# Patient Record
Sex: Female | Born: 1980 | Hispanic: No | Marital: Married | Smoking: Current every day smoker
Health system: Southern US, Community
[De-identification: ages and names within clinical notes are randomized; demographics above are authoritative.]

## PROBLEM LIST (undated history)

## (undated) DIAGNOSIS — O321XX0 Maternal care for breech presentation, not applicable or unspecified: Secondary | ICD-10-CM

## (undated) DIAGNOSIS — O4400 Placenta previa specified as without hemorrhage, unspecified trimester: Secondary | ICD-10-CM

## (undated) HISTORY — PX: WISDOM TOOTH EXTRACTION: SHX21

---

## 2007-06-24 ENCOUNTER — Ambulatory Visit (HOSPITAL_COMMUNITY): Admission: RE | Admit: 2007-06-24 | Discharge: 2007-06-24 | Payer: Self-pay | Admitting: Obstetrics & Gynecology

## 2007-09-16 ENCOUNTER — Inpatient Hospital Stay (HOSPITAL_COMMUNITY): Admission: AD | Admit: 2007-09-16 | Discharge: 2007-09-16 | Payer: Self-pay | Admitting: Obstetrics and Gynecology

## 2007-09-18 ENCOUNTER — Inpatient Hospital Stay (HOSPITAL_COMMUNITY): Admission: AD | Admit: 2007-09-18 | Discharge: 2007-09-20 | Payer: Self-pay | Admitting: Obstetrics and Gynecology

## 2008-09-27 IMAGING — US US FETAL BPP W/O NONSTRESS
1 series · 14 of 22 positions shown · non-contrast
Comparison: none

OBSTETRICAL ULTRASOUND:

 This ultrasound exam was performed in the [HOSPITAL] Ultrasound Department.  The OB US report was generated in the AS system, and faxed to the ordering physician.  This report is also available in [REDACTED] PACS.

[Series 1: us fetal bpp w/o nonstress · 0.28mm/px · 22 acquisitions, 14 frames shown]
[im 1/22]
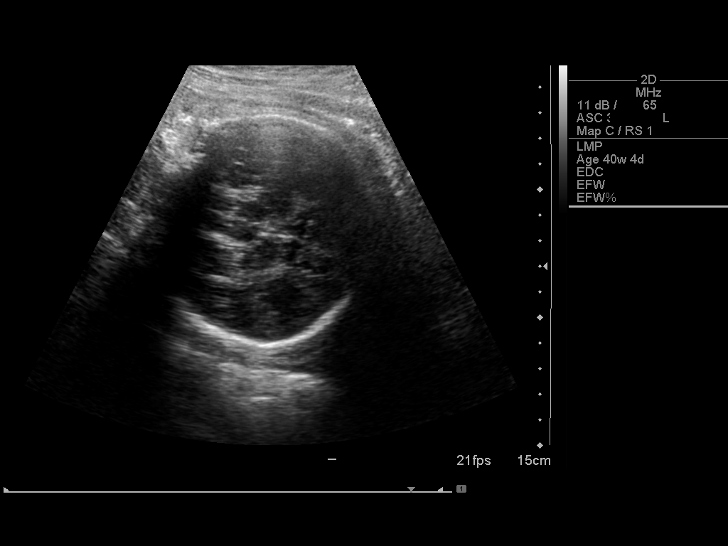
[im 3/22]
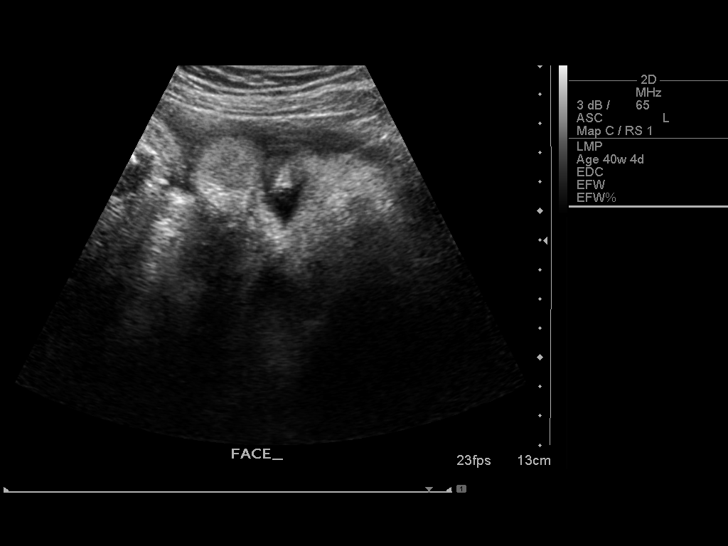
[im 4/22]
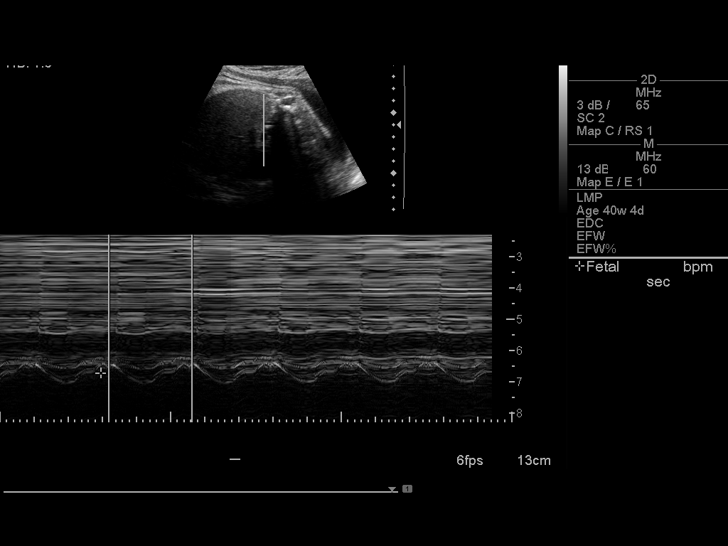
[im 6/22]
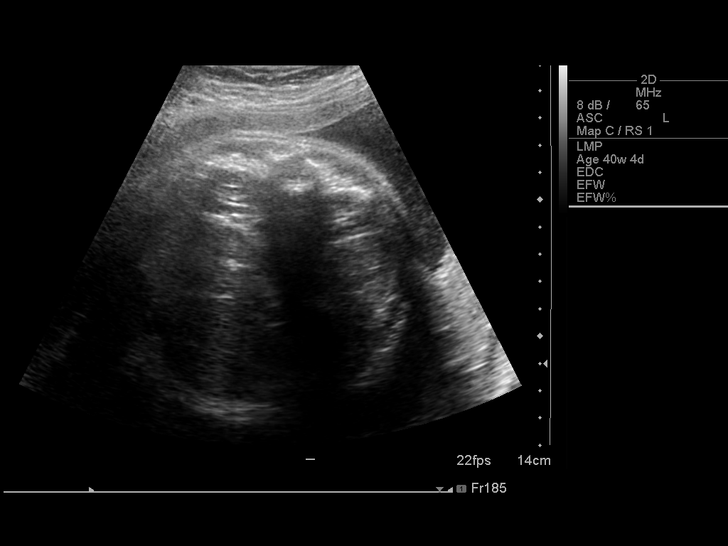
[im 8/22]
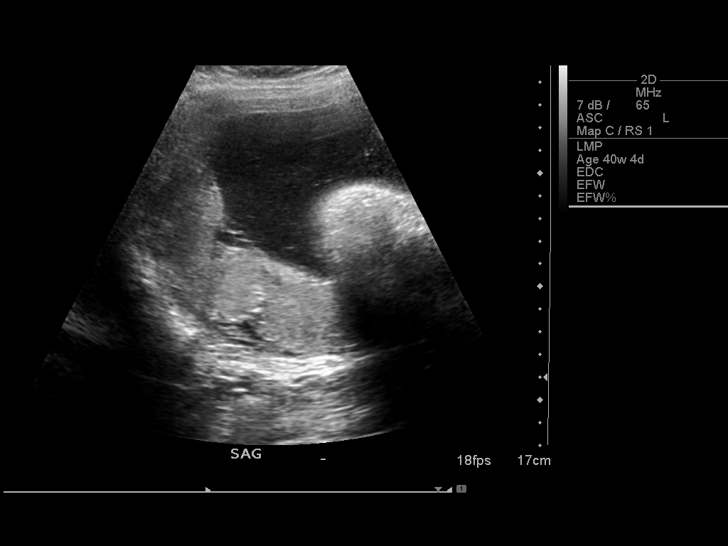
[im 9/22]
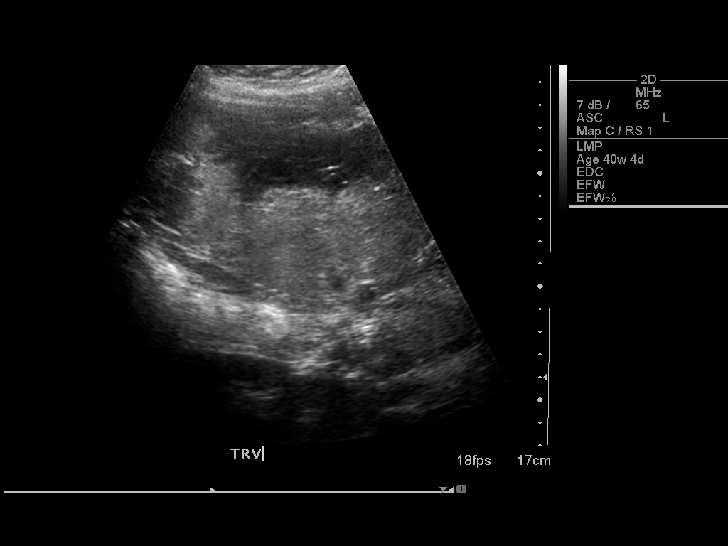
[im 11/22]
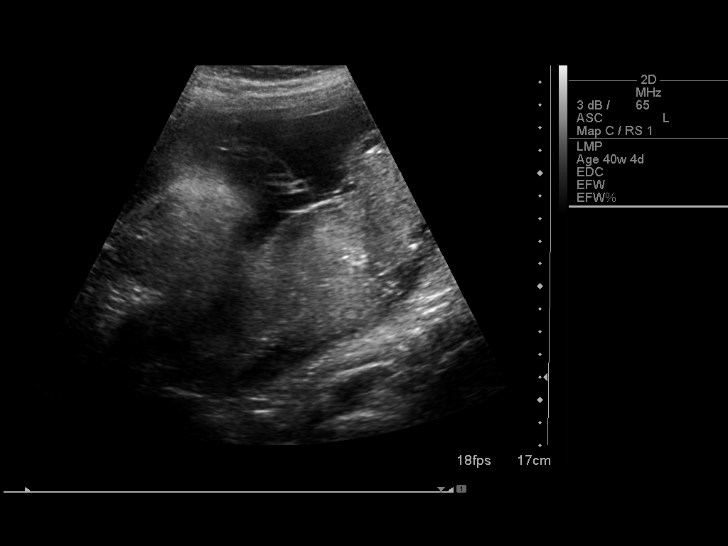
[im 12/22]
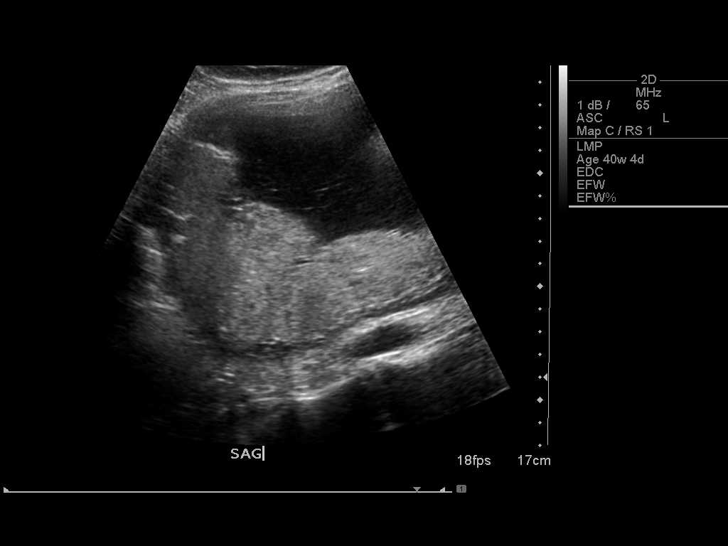
[im 14/22]
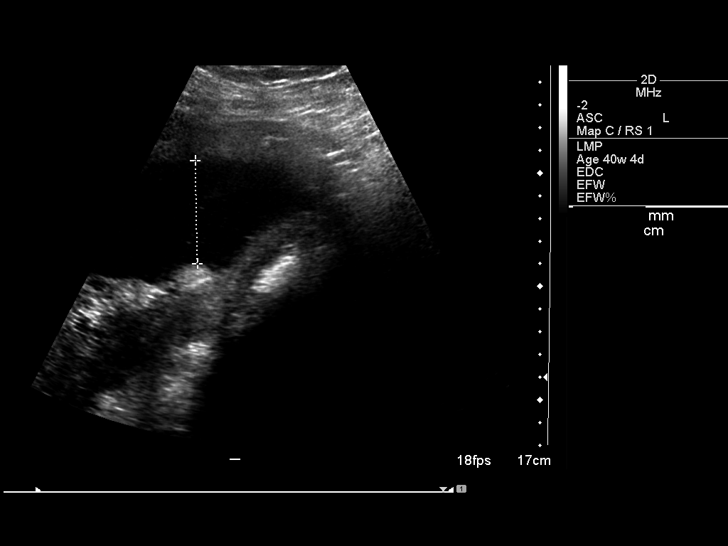
[im 15/22]
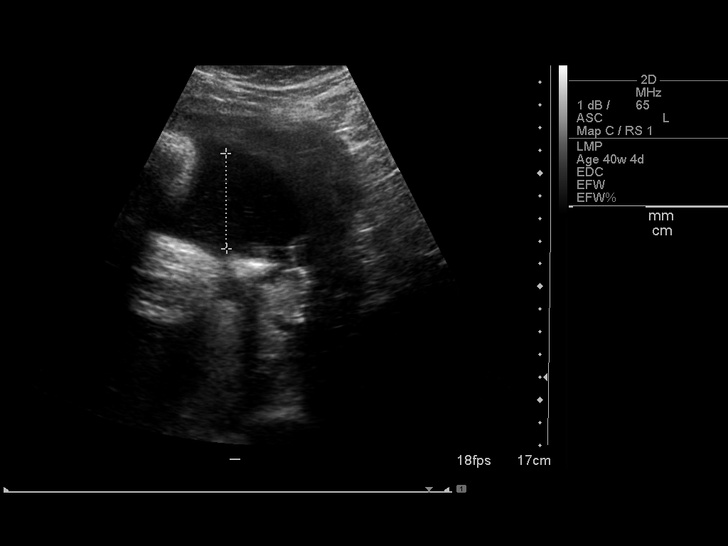
[im 17/22]
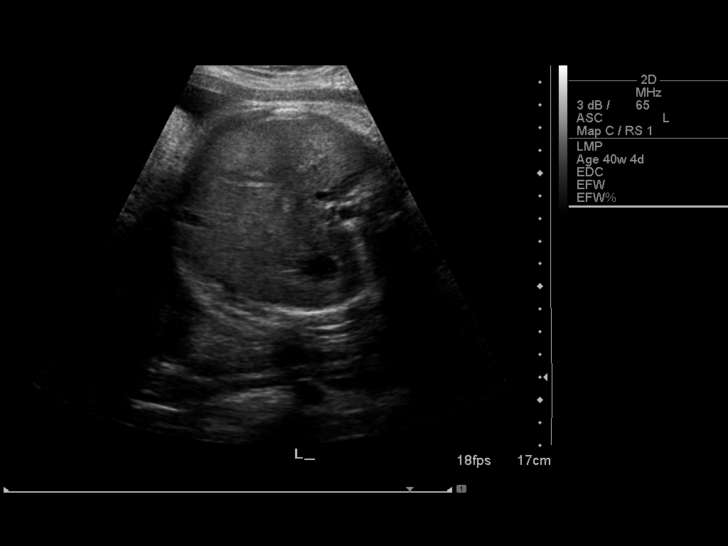
[im 19/22]
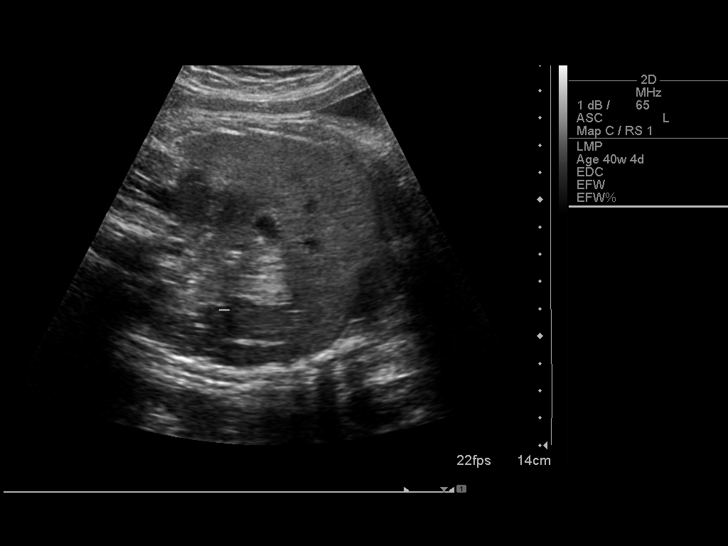
[im 20/22]
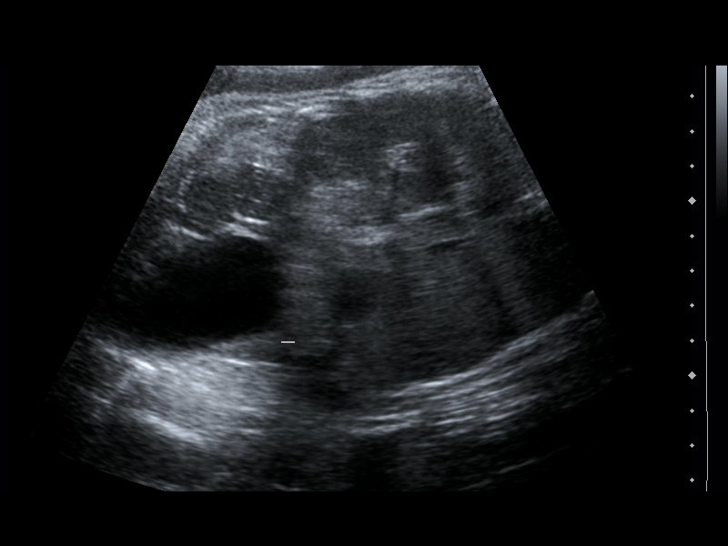
[im 22/22]
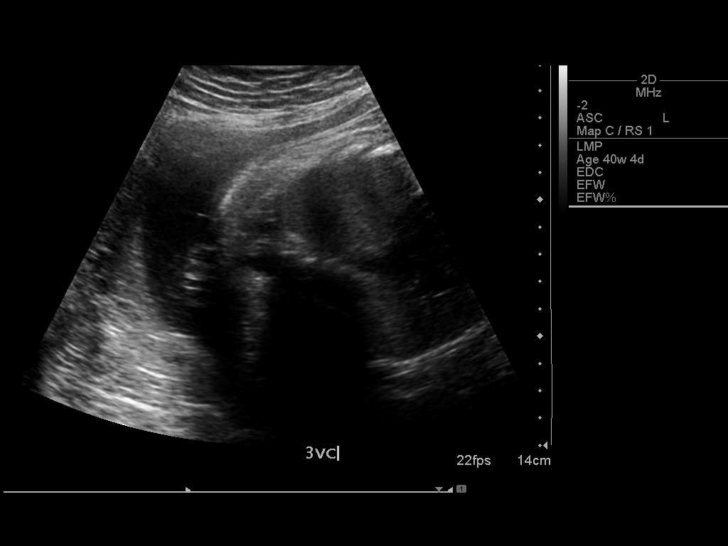

[14 of 22 positions shown; findings below may reference images not displayed]

IMPRESSION: See AS Obstetric US report.

## 2010-01-27 ENCOUNTER — Emergency Department (HOSPITAL_COMMUNITY): Admission: EM | Admit: 2010-01-27 | Discharge: 2010-01-27 | Payer: Self-pay | Admitting: Emergency Medicine

## 2010-11-24 DIAGNOSIS — O4400 Placenta previa specified as without hemorrhage, unspecified trimester: Secondary | ICD-10-CM

## 2010-11-24 DIAGNOSIS — O321XX0 Maternal care for breech presentation, not applicable or unspecified: Secondary | ICD-10-CM

## 2010-11-24 HISTORY — DX: Maternal care for breech presentation, not applicable or unspecified: O32.1XX0

## 2010-11-24 HISTORY — DX: Complete placenta previa nos or without hemorrhage, unspecified trimester: O44.00

## 2011-03-13 LAB — ABO/RH: RH Type: NEGATIVE

## 2011-03-13 LAB — HEPATITIS B SURFACE ANTIGEN: Hepatitis B Surface Ag: NEGATIVE

## 2011-03-13 LAB — GC/CHLAMYDIA PROBE AMP, GENITAL
Chlamydia: NEGATIVE
Gonorrhea: NEGATIVE

## 2011-03-13 LAB — HIV ANTIBODY (ROUTINE TESTING W REFLEX): HIV: NONREACTIVE

## 2011-03-13 LAB — RUBELLA ANTIBODY, IGM: Rubella: IMMUNE

## 2011-03-13 LAB — RPR: RPR: NONREACTIVE

## 2011-05-02 DIAGNOSIS — O4410 Placenta previa with hemorrhage, unspecified trimester: Principal | ICD-10-CM

## 2011-08-11 ENCOUNTER — Inpatient Hospital Stay (HOSPITAL_COMMUNITY)
Admission: AD | Admit: 2011-08-11 | Discharge: 2011-08-12 | DRG: 782 | Disposition: A | Discharge status: Home / Self Care | Payer: Self-pay | Source: Ambulatory Visit | Attending: Obstetrics and Gynecology | Admitting: Obstetrics and Gynecology

## 2011-08-11 ENCOUNTER — Encounter (HOSPITAL_COMMUNITY): Payer: Self-pay | Admitting: *Deleted

## 2011-08-11 DIAGNOSIS — O4420 Partial placenta previa NOS or without hemorrhage, unspecified trimester: Secondary | ICD-10-CM

## 2011-08-11 DIAGNOSIS — O4410 Placenta previa with hemorrhage, unspecified trimester: Principal | ICD-10-CM | POA: Diagnosis present

## 2011-08-11 DIAGNOSIS — O4413 Placenta previa with hemorrhage, third trimester: Secondary | ICD-10-CM

## 2011-08-11 MED ORDER — SODIUM CHLORIDE 0.9 % IJ SOLN: 3.0000 mL | Freq: Once | INTRAMUSCULAR | Status: DC

## 2011-08-11 MED ORDER — SODIUM CHLORIDE 0.9 % IJ SOLN
INTRAMUSCULAR | Status: AC
  Filled 2011-08-11: qty 3

## 2011-08-11 MED ORDER — FERROUS SULFATE 325 (65 FE) MG PO TABS
325.0000 mg | ORAL_TABLET | Freq: Once | ORAL | Status: DC
  Filled 2011-08-11: qty 1

## 2011-08-11 MED ORDER — ACETAMINOPHEN 500 MG PO TABS: 500.0000 mg | ORAL_TABLET | Freq: Four times a day (QID) | ORAL | Status: DC | PRN

## 2011-08-11 MED ORDER — PRENATAL PLUS 27-1 MG PO TABS: 1.0000 | ORAL_TABLET | Freq: Every day | ORAL | Status: DC

## 2011-08-11 NOTE — Progress Notes (Signed)
TO RM 155 VIA W/C.

## 2011-08-11 NOTE — ED Provider Notes (Signed)
Marlana Scotti-Rinaldi is a 30 y.o. G2P1001 at @29 .5 wks Chief Complaint  Patient presents with  . Vaginal Bleeding   History  She states she had a marginal placenta previa on her ultrasound in the office 10 days ago and now reports onset of small amount of bright red bleeding at 1700 today. Denies any orthostatic sx,  abdominal pain, cramping or contractions. Has been compliant with pelvic rest. Or any other episode of bleeding during pregnancy was 2-3 weeks ago when she had a scant amount of pink spotting.  Pregnancy course otherwise uncomplicated. PN record not available.Rh neg by report and received Rhophylac about 2 wks ago.   Review of systems: Neg for dysuria, hematuria, urinary frequency. No irritative vaginal discharge. No substance abuse. Past Medical History  Diagnosis Date  . No pertinent past medical history    Past Surgical History  Procedure Date  . Wisdom tooth extraction    History   Social History  . Marital Status: Married    Spouse Name: N/A    Number of Children: N/A  . Years of Education: N/A   Occupational History  . Not on file.   Social History Main Topics  . Smoking status: Never Smoker   . Smokeless tobacco: Never Used  . Alcohol Use: No  . Drug Use: No  . Sexually Active: No   Other Topics Concern  . Not on file   Social History Narrative  . No narrative on file   No current facility-administered medications on file prior to encounter.   No current outpatient prescriptions on file prior to encounter.   No Known Allergies   Objective Filed Vitals:   08/11/11 1859  BP: 115/67  Pulse: 62  Temp:   Resp:    Fetal heart rate baseline 130, reactive, moderate variability, no decelerations  Toco: No contractions   Physical Exam:  Gen: NAD Abdomen: Nontender , size consistent with dates Peripad: 5-6 cm red blood Cor: RRR w/o murmur Lungs: CTA bilat Ext: no edema, NT calves  Assessment  G2 P1 001 at 29-5/7 weeks with marginal  separation of placenta previa; hemodynamically stable. Fetal heart rate reassuring.  Plan  Discussed with Dr. Renaldo Fiddler. Placed in antenatal for observation with continuous fetal monitoring, bed rest, CBC in a.m.

## 2011-08-11 NOTE — Progress Notes (Signed)
Sudden onset bright red bleeding at 1700. States has placenta previa. Denies other problems with pregnancy.

## 2011-08-11 NOTE — ED Notes (Signed)
Small amt. bright red bleeding noted on panty liner on arrival.

## 2011-08-12 ENCOUNTER — Inpatient Hospital Stay (HOSPITAL_COMMUNITY): Payer: Self-pay

## 2011-08-12 DIAGNOSIS — O4420 Partial placenta previa NOS or without hemorrhage, unspecified trimester: Secondary | ICD-10-CM

## 2011-08-12 LAB — CBC
HCT: 33 % — ABNORMAL LOW (ref 36.0–46.0)
Hemoglobin: 11.3 g/dL — ABNORMAL LOW (ref 12.0–15.0)
MCH: 31.7 pg (ref 26.0–34.0)
MCHC: 34.2 g/dL (ref 30.0–36.0)
MCV: 92.7 fL (ref 78.0–100.0)
Platelets: 191 10*3/uL (ref 150–400)
RBC: 3.56 MIL/uL — ABNORMAL LOW (ref 3.87–5.11)
RDW: 12.8 % (ref 11.5–15.5)
WBC: 10.1 10*3/uL (ref 4.0–10.5)

## 2011-08-12 MED ORDER — MENTHOL 3 MG MT LOZG
1.0000 | LOZENGE | OROMUCOSAL | Status: DC | PRN
  Filled 2011-08-12: qty 9

## 2011-08-12 NOTE — Discharge Summary (Signed)
Admitting diagnosis: Intrauterine pregnancy at 29-6/7 bleeding with with marginal placenta previa.  Discharge diagnosis: The same  For complete history and physical please see the written note.  Course in the hospital: The patient was brought into the hospital and placed at bedrest. With this the bleeding basically resolved. It was fairly minimal upon admission. She was begun on IV. Monitoring revealed a reactive tracing with no contractions. The following morning her IV was discontinued. Ultrasound revealed a viable intrauterine pregnancy. Amniotic fluids index was 20. Biophysical profile is 8 out of 8. Cervical length was 3.8 cm. Placenta revealed a margin of the placenta on the right side of the cervix at the level of the internal os. The afternoon of the 18th she was having minimal dark spotting no active uterine activity. Fetal heart tracing was reassuring. Her CBC at come back to was normal. She'll be discharged home at this time.  In terms of complications: Number encountered during stay in the hospital  Patient is discharged in stable condition.  Patient is instructed to continue relative rest at home. She is to void vaginal entrance. She should call for any increasing bleeding. She report any increasing uterine activity or uterine tenderness. She has a plantar open the office early next week. Followup ultrasound be arranged at that time.

## 2011-08-12 NOTE — Progress Notes (Signed)
  Minimal spotting No ctx good fm sono normal AF Bpp 8/8 marginal previa no abruption.  Cbc ok D/C home on rest.

## 2011-08-12 NOTE — Progress Notes (Signed)
  S:  NO FURTHER BLEEDING O:  AFEBRILE VSS       GRAVID UTERUS NONTENDER       C/O SORE THROAT CLEAR TO VISUALIZATION       MONITOR  NO DECELS OR CTX'S FHR REASSURING A: IUP AT 29 6/7 SITH BLEEDING POSSIBLE LOW LYING PLACENTA P:  CHECK SONO CONTINUE REST

## 2011-09-03 LAB — CBC
HCT: 30.1 — ABNORMAL LOW
HCT: 36.5
Hemoglobin: 10.5 — ABNORMAL LOW
Hemoglobin: 12.7
MCHC: 34.8
MCHC: 35
MCV: 89.9
MCV: 90.4
Platelets: 186
Platelets: 222
RBC: 3.34 — ABNORMAL LOW
RBC: 4.04
RDW: 12.9
RDW: 13.2
WBC: 11.4 — ABNORMAL HIGH
WBC: 14.4 — ABNORMAL HIGH

## 2011-09-03 LAB — RPR: RPR Ser Ql: NONREACTIVE

## 2011-09-08 LAB — RH IMMUNE GLOBULIN WORKUP (NOT WOMEN'S HOSP)
ABO/RH(D): O NEG
Antibody Screen: NEGATIVE

## 2011-09-08 LAB — GLUCOSE TOLERANCE, 1 HOUR: Glucose, 1 Hour GTT: 115

## 2011-09-25 ENCOUNTER — Encounter (HOSPITAL_COMMUNITY): Payer: Self-pay

## 2011-09-26 ENCOUNTER — Encounter (HOSPITAL_COMMUNITY): Payer: Self-pay

## 2011-09-26 NOTE — Patient Instructions (Addendum)
   Your procedure is scheduled WU:[id number] November 8th  Enter through the Main Entrance of [facility] at: 11:30 am Pick up the phone at the desk and dial [phone] and inform us of your arrival.  Please call this number if you have any problems the morning of surgery: [phone]  Remember: Do not eat food after midnight:Wednesday Do not drink clear liquids after: Thursday 9am Take these medicines the morning of surgery with a SIP OF WATER:none  Do not wear jewelry, make-up, or FINGER nail polish Do not wear lotions, powders, or perfumes.  You may not  wear deodorant. Do not shave 48 hours prior to surgery. Do not bring valuables to the hospital.  Leave suitcase in the car. After Surgery it may be brought to your room. For patients being admitted to the hospital, checkout time is 11:00am the day of discharge.   Remember to use your hibiclens as instructed.Please shower with 1/2 bottle the evening before your surgery and the other 1/2 bottle the morning of surgery.

## 2011-09-30 ENCOUNTER — Encounter (HOSPITAL_COMMUNITY): Payer: Self-pay

## 2011-09-30 ENCOUNTER — Encounter (HOSPITAL_COMMUNITY)
Admission: RE | Admit: 2011-09-30 | Discharge: 2011-09-30 | Disposition: A | Discharge status: Home / Self Care | Payer: Self-pay | Source: Ambulatory Visit | Attending: Obstetrics and Gynecology | Admitting: Obstetrics and Gynecology

## 2011-09-30 HISTORY — DX: Complete placenta previa nos or without hemorrhage, unspecified trimester: O44.00

## 2011-09-30 HISTORY — DX: Maternal care for breech presentation, not applicable or unspecified: O32.1XX0

## 2011-09-30 LAB — CBC
HCT: 34.3 % — ABNORMAL LOW (ref 36.0–46.0)
Hemoglobin: 11.6 g/dL — ABNORMAL LOW (ref 12.0–15.0)
MCH: 31.2 pg (ref 26.0–34.0)
MCHC: 33.8 g/dL (ref 30.0–36.0)
MCV: 92.2 fL (ref 78.0–100.0)
Platelets: 184 10*3/uL (ref 150–400)
RBC: 3.72 MIL/uL — ABNORMAL LOW (ref 3.87–5.11)
RDW: 12.8 % (ref 11.5–15.5)
WBC: 8.4 10*3/uL (ref 4.0–10.5)

## 2011-09-30 LAB — RPR: RPR Ser Ql: NONREACTIVE

## 2011-09-30 LAB — SURGICAL PCR SCREEN
MRSA, PCR: NEGATIVE
Staphylococcus aureus: NEGATIVE

## 2011-09-30 NOTE — Pre-Procedure Instructions (Signed)
[place] Interpreter present at [employer] visit, will also be here day of surgery

## 2011-10-01 MED ORDER — DEXTROSE 5 % IV SOLN
1.0000 g | INTRAVENOUS | Status: DC
  Filled 2011-10-01: qty 1

## 2011-10-01 NOTE — H&P (Signed)
Wendy Gregory, Wendy Gregory         ACCOUNT NO.:  [id number]  MEDICAL RECORD NO.:  [id number]  LOCATION:                                FACILITY:  [facility]  PHYSICIAN:  Dineen Kid. Rana Snare, M.D.    DATE OF BIRTH:  09/28/1981  DATE OF ADMISSION: DATE OF DISCHARGE:                             HISTORY & PHYSICAL   HISTORY OF PRESENT ILLNESS:  Ms. Wendy Gregory is a 30 year old, G2, P1 at [redacted] weeks gestational age who presents for primary cesarean section.  Her estimated date of confinement is October 22, 2011.  Her pregnancy has been complicated by placenta previa.  The first part of her pregnancy was spent in [place].  On presenting to [place], she began having some vaginal bleeding.  Ultrasound evaluation confirmed a posterior placenta previa.  She has been maintained on bedrest and has had intermittent vaginal bleeding.  Most recent ultrasound confirms the persistence of the posterior previa.  Per the recommendations of Maternal Fetal Medicine, plan to deliver her between 36-37 weeks.  We are planning to deliver at 37 weeks by cesarean section due to placenta previa.  PAST MEDICAL HISTORY:  Negative.  PAST OB HISTORY:  She has had 1 vaginal delivery.  MEDICATIONS:  Prenatal vitamins.  ALLERGIES:  She has no known drug allergies.  She is O negative and has received RhoGAM.  PHYSICAL EXAMINATION:  VITAL SIGNS:  Blood pressure 110/68. HEART:  Regular rate and rhythm. LUNGS:  Clear to auscultation bilaterally. PELVIC:  Cervix was not checked.  Hemoglobin was 12.1.  IMPRESSION/PLAN:  Intrauterine pregnancy at [redacted] weeks gestational age with placenta previa and vaginal bleeding, stable on bed rest.  PLAN:  Primary low segment transverse cesarean section.  Risks and benefits of procedure were discussed at length which include but not limited to, risk of infection, bleeding, damage to the uterus, tubes, ovary, bowel, bladder, fetus, risk associated with anesthesia, and blood transfusion.  Her  informed consent was obtained.     Dineen Kid Rana Snare, M.D.     DCL/MEDQ  D:  10/01/2011  T:  10/01/2011  Job:  [id number]

## 2011-10-01 NOTE — H&P (Addendum)
  H&P Dictated #[id number] 10/01/11 0830 DL  Pt seen and examined.  Informed consent for LSTCS given DL 95/6/21 3086 DL

## 2011-10-02 ENCOUNTER — Encounter (HOSPITAL_COMMUNITY): Payer: Self-pay | Admitting: Anesthesiology

## 2011-10-02 ENCOUNTER — Inpatient Hospital Stay (HOSPITAL_COMMUNITY)
Admission: RE | Admit: 2011-10-02 | Discharge: 2011-10-04 | DRG: 766 | Disposition: A | Discharge status: Home / Self Care | Payer: Self-pay | Source: Ambulatory Visit | Attending: Obstetrics and Gynecology | Admitting: Obstetrics and Gynecology

## 2011-10-02 ENCOUNTER — Encounter (HOSPITAL_COMMUNITY): Payer: Self-pay | Admitting: *Deleted

## 2011-10-02 ENCOUNTER — Encounter (HOSPITAL_COMMUNITY)
Admission: RE | Disposition: A | Discharge status: Home / Self Care | Payer: Self-pay | Source: Ambulatory Visit | Attending: Obstetrics and Gynecology

## 2011-10-02 ENCOUNTER — Inpatient Hospital Stay (HOSPITAL_COMMUNITY): Payer: Self-pay | Admitting: Anesthesiology

## 2011-10-02 DIAGNOSIS — O4420 Partial placenta previa NOS or without hemorrhage, unspecified trimester: Secondary | ICD-10-CM

## 2011-10-02 DIAGNOSIS — O4410 Placenta previa with hemorrhage, unspecified trimester: Principal | ICD-10-CM | POA: Diagnosis present

## 2011-10-02 LAB — CBC
HCT: 35.5 % — ABNORMAL LOW (ref 36.0–46.0)
Hemoglobin: 12 g/dL (ref 12.0–15.0)
MCH: 31 pg (ref 26.0–34.0)
MCHC: 33.8 g/dL (ref 30.0–36.0)
MCV: 91.7 fL (ref 78.0–100.0)
Platelets: 180 10*3/uL (ref 150–400)
RBC: 3.87 MIL/uL (ref 3.87–5.11)
RDW: 12.6 % (ref 11.5–15.5)
WBC: 9.2 10*3/uL (ref 4.0–10.5)

## 2011-10-02 LAB — DIFFERENTIAL
Basophils Absolute: 0 10*3/uL (ref 0.0–0.1)
Basophils Relative: 0 % (ref 0–1)
Eosinophils Absolute: 0.1 10*3/uL (ref 0.0–0.7)
Eosinophils Relative: 1 % (ref 0–5)
Lymphocytes Relative: 16 % (ref 12–46)
Lymphs Abs: 1.5 10*3/uL (ref 0.7–4.0)
Monocytes Absolute: 0.7 10*3/uL (ref 0.1–1.0)
Monocytes Relative: 7 % (ref 3–12)
Neutro Abs: 7 10*3/uL (ref 1.7–7.7)
Neutrophils Relative %: 76 % (ref 43–77)

## 2011-10-02 LAB — PREPARE RBC (CROSSMATCH)

## 2011-10-02 SURGERY — Surgical Case
Anesthesia: Regional

## 2011-10-02 MED ORDER — NALBUPHINE HCL 10 MG/ML IJ SOLN
5.0000 mg | INTRAMUSCULAR | Status: DC | PRN
  Filled 2011-10-02: qty 1

## 2011-10-02 MED ORDER — DIPHENHYDRAMINE HCL 50 MG/ML IJ SOLN
12.5000 mg | INTRAMUSCULAR | Status: DC | PRN
  Administered 2011-10-02: 12.5 mg via INTRAVENOUS

## 2011-10-02 MED ORDER — MEPERIDINE HCL 25 MG/ML IJ SOLN: 6.2500 mg | INTRAMUSCULAR | Status: DC | PRN

## 2011-10-02 MED ORDER — DEXTROSE 5 % IV SOLN
1.0000 g | INTRAVENOUS | Status: DC | PRN
  Administered 2011-10-02: 1 g via INTRAVENOUS

## 2011-10-02 MED ORDER — KETOROLAC TROMETHAMINE 30 MG/ML IJ SOLN: 30.0000 mg | Freq: Four times a day (QID) | INTRAMUSCULAR | Status: AC | PRN

## 2011-10-02 MED ORDER — LACTATED RINGERS IV SOLN
INTRAVENOUS | Status: DC
  Administered 2011-10-02 (×3): via INTRAVENOUS

## 2011-10-02 MED ORDER — PHENYLEPHRINE 40 MCG/ML (10ML) SYRINGE FOR IV PUSH (FOR BLOOD PRESSURE SUPPORT)
PREFILLED_SYRINGE | INTRAVENOUS | Status: AC
  Filled 2011-10-02: qty 10

## 2011-10-02 MED ORDER — ONDANSETRON HCL 4 MG/2ML IJ SOLN: 4.0000 mg | Freq: Three times a day (TID) | INTRAMUSCULAR | Status: DC | PRN

## 2011-10-02 MED ORDER — SIMETHICONE 80 MG PO CHEW: 80.0000 mg | CHEWABLE_TABLET | ORAL | Status: DC | PRN

## 2011-10-02 MED ORDER — METOCLOPRAMIDE HCL 5 MG/ML IJ SOLN
10.0000 mg | Freq: Three times a day (TID) | INTRAMUSCULAR | Status: DC | PRN
  Administered 2011-10-02: 10 mg via INTRAVENOUS

## 2011-10-02 MED ORDER — LANOLIN HYDROUS EX OINT: 1.0000 "application " | TOPICAL_OINTMENT | CUTANEOUS | Status: DC | PRN

## 2011-10-02 MED ORDER — LACTATED RINGERS IV SOLN
INTRAVENOUS | Status: DC
  Administered 2011-10-03: 01:00:00 via INTRAVENOUS

## 2011-10-02 MED ORDER — OXYTOCIN 10 UNIT/ML IJ SOLN
INTRAMUSCULAR | Status: AC
  Filled 2011-10-02: qty 2

## 2011-10-02 MED ORDER — DIBUCAINE 1 % RE OINT: 1.0000 "application " | TOPICAL_OINTMENT | RECTAL | Status: DC | PRN

## 2011-10-02 MED ORDER — PRENATAL PLUS 27-1 MG PO TABS
1.0000 | ORAL_TABLET | Freq: Every day | ORAL | Status: DC
  Administered 2011-10-03 – 2011-10-04 (×2): 1 via ORAL
  Filled 2011-10-02 (×2): qty 1

## 2011-10-02 MED ORDER — DIPHENHYDRAMINE HCL 25 MG PO CAPS: 25.0000 mg | ORAL_CAPSULE | Freq: Four times a day (QID) | ORAL | Status: DC | PRN

## 2011-10-02 MED ORDER — WITCH HAZEL-GLYCERIN EX PADS: 1.0000 "application " | MEDICATED_PAD | CUTANEOUS | Status: DC | PRN

## 2011-10-02 MED ORDER — MORPHINE SULFATE 0.5 MG/ML IJ SOLN
INTRAMUSCULAR | Status: AC
  Filled 2011-10-02: qty 10

## 2011-10-02 MED ORDER — OXYCODONE-ACETAMINOPHEN 5-325 MG PO TABS: 1.0000 | ORAL_TABLET | ORAL | Status: DC | PRN

## 2011-10-02 MED ORDER — ONDANSETRON HCL 4 MG/2ML IJ SOLN
INTRAMUSCULAR | Status: DC | PRN
  Administered 2011-10-02: 4 mg via INTRAVENOUS

## 2011-10-02 MED ORDER — SODIUM CHLORIDE 0.9 % IJ SOLN: 3.0000 mL | INTRAMUSCULAR | Status: DC | PRN

## 2011-10-02 MED ORDER — NALOXONE HCL 0.4 MG/ML IJ SOLN: 0.4000 mg | INTRAMUSCULAR | Status: DC | PRN

## 2011-10-02 MED ORDER — SIMETHICONE 80 MG PO CHEW
80.0000 mg | CHEWABLE_TABLET | Freq: Three times a day (TID) | ORAL | Status: DC
  Administered 2011-10-02 – 2011-10-04 (×4): 80 mg via ORAL

## 2011-10-02 MED ORDER — SCOPOLAMINE 1 MG/3DAYS TD PT72: 1.0000 | MEDICATED_PATCH | Freq: Once | TRANSDERMAL | Status: DC

## 2011-10-02 MED ORDER — IBUPROFEN 600 MG PO TABS
600.0000 mg | ORAL_TABLET | Freq: Four times a day (QID) | ORAL | Status: DC
  Administered 2011-10-03 – 2011-10-04 (×6): 600 mg via ORAL
  Filled 2011-10-02 (×2): qty 1

## 2011-10-02 MED ORDER — KETOROLAC TROMETHAMINE 60 MG/2ML IM SOLN
60.0000 mg | Freq: Once | INTRAMUSCULAR | Status: AC | PRN
  Administered 2011-10-02: 60 mg via INTRAMUSCULAR

## 2011-10-02 MED ORDER — DIPHENHYDRAMINE HCL 50 MG/ML IJ SOLN
INTRAMUSCULAR | Status: AC
  Administered 2011-10-02: 12.5 mg via INTRAVENOUS
  Filled 2011-10-02: qty 1

## 2011-10-02 MED ORDER — ONDANSETRON HCL 4 MG PO TABS: 4.0000 mg | ORAL_TABLET | ORAL | Status: DC | PRN

## 2011-10-02 MED ORDER — HYDROMORPHONE HCL PF 1 MG/ML IJ SOLN: 0.2500 mg | INTRAMUSCULAR | Status: DC | PRN

## 2011-10-02 MED ORDER — SCOPOLAMINE 1 MG/3DAYS TD PT72
MEDICATED_PATCH | TRANSDERMAL | Status: AC
  Administered 2011-10-02: 1.5 mg
  Filled 2011-10-02: qty 1

## 2011-10-02 MED ORDER — OXYTOCIN 20 UNITS IN LACTATED RINGERS INFUSION - SIMPLE
125.0000 mL/h | INTRAVENOUS | Status: AC
  Administered 2011-10-02: 125 mL/h via INTRAVENOUS
  Filled 2011-10-02: qty 1000

## 2011-10-02 MED ORDER — DIPHENHYDRAMINE HCL 25 MG PO CAPS
25.0000 mg | ORAL_CAPSULE | ORAL | Status: DC | PRN
  Filled 2011-10-02: qty 1

## 2011-10-02 MED ORDER — METOCLOPRAMIDE HCL 5 MG/ML IJ SOLN
INTRAMUSCULAR | Status: AC
  Administered 2011-10-02: 10 mg via INTRAVENOUS
  Filled 2011-10-02: qty 2

## 2011-10-02 MED ORDER — FENTANYL CITRATE 0.05 MG/ML IJ SOLN
INTRAMUSCULAR | Status: AC
  Filled 2011-10-02: qty 2

## 2011-10-02 MED ORDER — ZOLPIDEM TARTRATE 5 MG PO TABS: 5.0000 mg | ORAL_TABLET | Freq: Every evening | ORAL | Status: DC | PRN

## 2011-10-02 MED ORDER — EPHEDRINE SULFATE 50 MG/ML IJ SOLN
INTRAMUSCULAR | Status: DC | PRN
  Administered 2011-10-02: 50 mg via INTRAVENOUS

## 2011-10-02 MED ORDER — DIPHENHYDRAMINE HCL 50 MG/ML IJ SOLN: 25.0000 mg | INTRAMUSCULAR | Status: DC | PRN

## 2011-10-02 MED ORDER — ONDANSETRON HCL 4 MG/2ML IJ SOLN
INTRAMUSCULAR | Status: AC
  Filled 2011-10-02: qty 2

## 2011-10-02 MED ORDER — EPHEDRINE 5 MG/ML INJ
INTRAVENOUS | Status: AC
  Filled 2011-10-02: qty 10

## 2011-10-02 MED ORDER — PHENYLEPHRINE HCL 10 MG/ML IJ SOLN
INTRAMUSCULAR | Status: DC | PRN
  Administered 2011-10-02 (×2): 80 ug via INTRAVENOUS
  Administered 2011-10-02: 20 ug via INTRAVENOUS

## 2011-10-02 MED ORDER — TETANUS-DIPHTH-ACELL PERTUSSIS 5-2.5-18.5 LF-MCG/0.5 IM SUSP: 0.5000 mL | Freq: Once | INTRAMUSCULAR | Status: DC

## 2011-10-02 MED ORDER — PHENYLEPHRINE 40 MCG/ML (10ML) SYRINGE FOR IV PUSH (FOR BLOOD PRESSURE SUPPORT)
PREFILLED_SYRINGE | INTRAVENOUS | Status: AC
  Filled 2011-10-02: qty 5

## 2011-10-02 MED ORDER — ONDANSETRON HCL 4 MG/2ML IJ SOLN: 4.0000 mg | INTRAMUSCULAR | Status: DC | PRN

## 2011-10-02 MED ORDER — SENNOSIDES-DOCUSATE SODIUM 8.6-50 MG PO TABS
2.0000 | ORAL_TABLET | Freq: Every day | ORAL | Status: DC
  Administered 2011-10-02 – 2011-10-03 (×2): 2 via ORAL

## 2011-10-02 MED ORDER — OXYTOCIN 20 UNITS IN LACTATED RINGERS INFUSION - SIMPLE
INTRAVENOUS | Status: DC | PRN
  Administered 2011-10-02: 40 [IU] via INTRAVENOUS

## 2011-10-02 MED ORDER — NALBUPHINE HCL 10 MG/ML IJ SOLN
5.0000 mg | INTRAMUSCULAR | Status: DC | PRN
  Administered 2011-10-02: 10 mg via INTRAVENOUS
  Administered 2011-10-03: 5 mg via INTRAVENOUS
  Filled 2011-10-02: qty 1

## 2011-10-02 MED ORDER — MENTHOL 3 MG MT LOZG: 1.0000 | LOZENGE | OROMUCOSAL | Status: DC | PRN

## 2011-10-02 MED ORDER — IBUPROFEN 600 MG PO TABS
600.0000 mg | ORAL_TABLET | Freq: Four times a day (QID) | ORAL | Status: DC | PRN
  Filled 2011-10-02 (×4): qty 1

## 2011-10-02 MED ORDER — KETOROLAC TROMETHAMINE 60 MG/2ML IM SOLN
INTRAMUSCULAR | Status: AC
  Administered 2011-10-02: 60 mg via INTRAMUSCULAR
  Filled 2011-10-02: qty 2

## 2011-10-02 MED ORDER — NALOXONE HCL 0.4 MG/ML IJ SOLN: 1.0000 ug/kg/h | INTRAMUSCULAR | Status: DC | PRN

## 2011-10-02 MED ORDER — PRENATAL PLUS 27-1 MG PO TABS: 1.0000 | ORAL_TABLET | Freq: Every day | ORAL | Status: DC

## 2011-10-02 SURGICAL SUPPLY — 27 items
CABLE HIGH FREQUENCY MONO STRZ (ELECTRODE) ×2 IMPLANT
CLOTH BEACON ORANGE TIMEOUT ST (SAFETY) ×2 IMPLANT
DRESSING TELFA 8X3 (GAUZE/BANDAGES/DRESSINGS) IMPLANT
DRSG COVADERM 4X10 (GAUZE/BANDAGES/DRESSINGS) ×4 IMPLANT
ELECT REM PT RETURN 9FT ADLT (ELECTROSURGICAL) ×4
ELECTRODE REM PT RTRN 9FT ADLT (ELECTROSURGICAL) ×2 IMPLANT
EXTRACTOR VACUUM M CUP 4 TUBE (SUCTIONS) ×2 IMPLANT
GAUZE SPONGE 4X4 12PLY STRL LF (GAUZE/BANDAGES/DRESSINGS) IMPLANT
GLOVE BIO SURGEON STRL SZ8 (GLOVE) ×2 IMPLANT
GLOVE SURG ORTHO 8.0 STRL STRW (GLOVE) ×2 IMPLANT
GOWN PREVENTION PLUS LG XLONG (DISPOSABLE) ×4 IMPLANT
KIT ABG SYR 3ML LUER SLIP (SYRINGE) ×2 IMPLANT
NEEDLE HYPO 25X5/8 SAFETYGLIDE (NEEDLE) ×2 IMPLANT
NS IRRIG 1000ML POUR BTL (IV SOLUTION) ×2 IMPLANT
PACK C SECTION WH (CUSTOM PROCEDURE TRAY) ×2 IMPLANT
PAD ABD 7.5X8 STRL (GAUZE/BANDAGES/DRESSINGS) IMPLANT
SLEEVE SCD COMPRESS KNEE MED (MISCELLANEOUS) IMPLANT
STAPLER VISISTAT 35W (STAPLE) IMPLANT
SUT MNCRL 0 VIOLET CTX 36 (SUTURE) ×3 IMPLANT
SUT MONOCRYL 0 CTX 36 (SUTURE) ×3
SUT PDS AB 1 CT  36 (SUTURE)
SUT PDS AB 1 CT 36 (SUTURE) IMPLANT
SUT VIC AB 1 CTX 36 (SUTURE)
SUT VIC AB 1 CTX36XBRD ANBCTRL (SUTURE) IMPLANT
TOWEL OR 17X24 6PK STRL BLUE (TOWEL DISPOSABLE) ×4 IMPLANT
TRAY FOLEY CATH 14FR (SET/KITS/TRAYS/PACK) ×2 IMPLANT
WATER STERILE IRR 1000ML POUR (IV SOLUTION) ×2 IMPLANT

## 2011-10-02 NOTE — Transfer of Care (Signed)
Immediate Anesthesia Transfer of Care Note  Patient: Wendy Gregory  Procedure(s) Performed:  CESAREAN SECTION - primary  Patient Location: PACU  Anesthesia Type: Spinal  Level of Consciousness: awake, alert  and oriented  Airway & Oxygen Therapy: Patient Spontanous Breathing  Post-op Assessment: Report given to PACU RN and Post -op Vital signs reviewed and stable  Post vital signs: Reviewed and stable  Complications: No apparent anesthesia complications

## 2011-10-02 NOTE — Progress Notes (Signed)
Pt arrived with laura ( [place]ail[place] interpretor).Marland Kitchen

## 2011-10-02 NOTE — Op Note (Signed)
Cesarean Section Procedure Note  Pre-operative Diagnosis: IUP at 37 Weeks, Placenta Previa    Post-operative Diagnosis: same  Surgeon: Turner Daniels   Assistants: Jennette Kettle  Anesthesia:Spinal  Procedure:  Low Segment Transverse cesarean section  Procedure Details  The patient was seen in the Holding Room. The risks, benefits, complications, treatment options, and expected outcomes were discussed with the patient.  The patient concurred with the proposed plan, giving informed consent.  The site of surgery properly noted/marked.. A Time Out was held and the above information confirmed.  After induction of anesthesia, the patient was draped and prepped in the usual sterile manner. A Pfannenstiel incision was made and carried down through the subcutaneous tissue to the fascia. Fascial incision was made and extended transversely. The fascia was separated from the underlying rectus tissue superiorly and inferiorly. The peritoneum was identified and entered. Peritoneal incision was extended longitudinally. The utero-vesical peritoneal reflection was incised transversely and the bladder flap was bluntly freed from the lower uterine segment. A low transverse uterine incision was made. Delivered from vtx with a VE single pull,   Apgar scores of 3 at one minute and 9 at five minutes. After the umbilical cord was clamped and cut cord blood was obtained for evaluation. The placenta was removed intact and appeared normal. The uterine outline, tubes and ovaries appeared normal. The uterine incision was closed with running locked sutures of 0 monocryl and imbricated with 0 monocryl. Hemostasis was observed. Lavage was carried out until clear. The peritoneum was then closed with 0 monocryl and rectus muscles plicated in the midline.  After hemostasis was assured, the fascia was then reapproximated with running sutures of 0 Vicryl. Irrigation was applied and after adequate hemostasis was assured, the skin was  reapproximated with staples.  Instrument, sponge, and needle counts were correct prior the abdominal closure and at the conclusion of the case. The patient received cefotetan preoperatively.  Findings: Viable female, posterior wall previa, ph art 7.35  Estimated Blood Loss:  1000         Specimens: Placenta was sent to pathology         Complications:  None

## 2011-10-02 NOTE — Progress Notes (Signed)
UR Chart review completed.  

## 2011-10-02 NOTE — Anesthesia Procedure Notes (Addendum)
Spinal  Patient location during procedure: OR Preanesthetic Checklist Completed: patient identified, site marked, surgical consent, pre-op evaluation, timeout performed, risks and benefits discussed and monitors and equipment checked Spinal Block Patient position: sitting Prep: DuraPrep Patient monitoring: heart rate, cardiac monitor, continuous pulse ox and blood pressure Approach: midline Location: L3-4 Injection technique: single-shot Needle Needle type: Sprotte  Needle gauge: 24 G Needle length: 9 cm Assessment Sensory level: T4 Additional Notes Spinal Dosage in OR  Bupivicaine ml       1.5 PFMS04   mcg        150 Fentanyl mcg            20

## 2011-10-03 LAB — CBC
HCT: 26.6 % — ABNORMAL LOW (ref 36.0–46.0)
Hemoglobin: 8.9 g/dL — ABNORMAL LOW (ref 12.0–15.0)
MCH: 30.9 pg (ref 26.0–34.0)
MCHC: 33.5 g/dL (ref 30.0–36.0)
MCV: 92.4 fL (ref 78.0–100.0)
Platelets: 149 10*3/uL — ABNORMAL LOW (ref 150–400)
RBC: 2.88 MIL/uL — ABNORMAL LOW (ref 3.87–5.11)
RDW: 12.4 % (ref 11.5–15.5)
WBC: 8.4 10*3/uL (ref 4.0–10.5)

## 2011-10-03 NOTE — Anesthesia Preprocedure Evaluation (Signed)
Anesthesia Evaluation  Patient identified by MRN, date of birth, ID band Patient awake    Reviewed: Allergy & Precautions, H&P , Patient's Chart, lab work & pertinent test results  Airway Mallampati: II TM Distance: >3 FB Neck ROM: full    Dental No notable dental hx.    Pulmonary  clear to auscultation  Pulmonary exam normal       Cardiovascular Exercise Tolerance: Good regular Normal    Neuro/Psych    GI/Hepatic   Endo/Other    Renal/GU      Musculoskeletal   Abdominal   Peds  Hematology   Anesthesia Other Findings   Reproductive/Obstetrics                           Anesthesia Physical Anesthesia Plan  ASA: II  Anesthesia Plan: Spinal   Post-op Pain Management:    Induction:   Airway Management Planned:   Additional Equipment:   Intra-op Plan:   Post-operative Plan:   Informed Consent: I have reviewed the patients History and Physical, chart, labs and discussed the procedure including the risks, benefits and alternatives for the proposed anesthesia with the patient or authorized representative who has indicated his/her understanding and acceptance.   Dental Advisory Given  Plan Discussed with: CRNA  Anesthesia Plan Comments: (Pre op completed by Dr Pamala Hurry, and verbally reported to me. Filled out per memory on POD 1  Lab work confirmed with CRNA in room. Platelets okay. Discussed spinal anesthetic, and patient consents to the procedure:  included risk of possible headache,backache, failed block, allergic reaction, and nerve injury. This patient was asked if she had any questions or concerns before the procedure started. )        Anesthesia Quick Evaluation

## 2011-10-03 NOTE — Progress Notes (Signed)
Subjective: Postpartum Day 1: Cesarean Delivery Patient reports tolerating PO and no problems voiding.    Objective: Vital signs in last 24 hours: Temp:  [97 F (36.1 C)-98.1 F (36.7 C)] 98.1 F (36.7 C) (11/09 0445) Pulse Rate:  [60-80] 66  (11/09 0445) Resp:  [16-24] 18  (11/09 0445) BP: (100-123)/(44-75) 100/65 mmHg (11/09 0445) SpO2:  [96 %-100 %] 98 % (11/09 0445) Weight:  [72.576 kg (160 lb)] 160 lb (72.576 kg) (11/08 1632)  Physical Exam:  General: alert and cooperative Lochia: appropriate Uterine Fundus: firm abd dressing noted with small old drainage noted on bandage DVT Evaluation: No evidence of DVT seen on physical exam.   Basename 10/03/11 0520 10/02/11 1133  HGB 8.9* 12.0  HCT 26.6* 35.5*    Assessment/Plan: Status post Cesarean section. Doing well postoperatively.  Continue current care.  CURTIS,CAROL G 10/03/2011, 8:45 AM

## 2011-10-03 NOTE — Anesthesia Postprocedure Evaluation (Signed)
  Anesthesia Post-op Note  Patient: Wendy Gregory  Procedure(s) Performed:  CESAREAN SECTION - primary  Patient Location: Mother/Baby  Anesthesia Type: Spinal  Level of Consciousness: awake, alert  and oriented  Airway and Oxygen Therapy: Patient Spontanous Breathing  Post-op Pain: none  Post-op Assessment: Post-op Vital signs reviewed, Patient's Cardiovascular Status Stable, No headache, No backache, No residual numbness and No residual motor weakness  Post-op Vital Signs: Reviewed and stable  Complications: No apparent anesthesia complications

## 2011-10-04 MED ORDER — OXYCODONE-ACETAMINOPHEN 5-500 MG PO CAPS: 1.0000 | ORAL_CAPSULE | ORAL | Status: AC | PRN

## 2011-10-04 NOTE — Progress Notes (Signed)
Subjective: Postpartum Day 2: Cesarean Delivery Patient reports tolerating PO.  Desires d/c home.     Objective: Vital signs in last 24 hours: Temp:  [98.1 F (36.7 C)] 98.1 F (36.7 C) (11/10 0639) Pulse Rate:  [69] 69  (11/10 0639) Resp:  [18] 18  (11/10 0639) BP: (115)/(72) 115/72 mmHg (11/10 1610)  Physical Exam:  General: alert, cooperative and no distress Lochia: appropriate Uterine Fundus: firm Incision: healing well DVT Evaluation: No evidence of DVT seen on physical exam.   Basename 10/03/11 0520 10/02/11 1133  HGB 8.9* 12.0  HCT 26.6* 35.5*    Assessment/Plan: Status post Cesarean section. Doing well postoperatively.  Plan to  Dc home with office visit 2-3 days for staple removal.  Arwilda Georgia C 10/04/2011, 9:17 AM

## 2011-10-06 LAB — TYPE AND SCREEN
ABO/RH(D): O NEG
Antibody Screen: POSITIVE
DAT, IgG: NEGATIVE
Unit division: 0
Unit division: 0
Unit division: 0
Unit division: 0

## 2011-10-06 NOTE — Discharge Summary (Signed)
Obstetric Discharge Summary Reason for Admission: cesarean section Prenatal Procedures: ultrasound Intrapartum Procedures: cesarean: low cervical, transverse Postpartum Procedures: none Complications-Operative and Postpartum: none Hemoglobin  Date Value Range Status  10/03/2011 8.9* 12.0-15.0 (g/dL) Final     DELTA CHECK NOTED     REPEATED TO VERIFY     HCT  Date Value Range Status  10/03/2011 26.6* 36.0-46.0 (%) Final    Discharge Diagnoses: Term Pregnancy-delivered  Discharge Information: Date: 10/06/2011 Activity: pelvic rest Diet: routine Medications: PNV, Ibuprofen and Percocet Condition: stable Instructions: refer to practice specific booklet Discharge to: home   Newborn Data: Live born female  Birth Weight: 5 lb 15.8 oz (2716 g) APGAR: 9, 9  Home with mother.  Wendy Gregory G 10/06/2011, 8:15 AM

## 2011-10-07 ENCOUNTER — Encounter (HOSPITAL_COMMUNITY): Payer: Self-pay | Admitting: Obstetrics and Gynecology

## 2013-01-10 ENCOUNTER — Encounter (HOSPITAL_COMMUNITY): Payer: Self-pay | Admitting: Emergency Medicine

## 2013-01-10 ENCOUNTER — Emergency Department (HOSPITAL_COMMUNITY): Payer: BC Managed Care – PPO

## 2013-01-10 ENCOUNTER — Emergency Department (HOSPITAL_COMMUNITY)
Admission: EM | Admit: 2013-01-10 | Discharge: 2013-01-10 | Disposition: A | Discharge status: Home / Self Care | Payer: BC Managed Care – PPO | Attending: Emergency Medicine | Admitting: Emergency Medicine

## 2013-01-10 DIAGNOSIS — F17200 Nicotine dependence, unspecified, uncomplicated: Secondary | ICD-10-CM | POA: Insufficient documentation

## 2013-01-10 DIAGNOSIS — Y929 Unspecified place or not applicable: Secondary | ICD-10-CM | POA: Insufficient documentation

## 2013-01-10 DIAGNOSIS — Z8742 Personal history of other diseases of the female genital tract: Secondary | ICD-10-CM | POA: Insufficient documentation

## 2013-01-10 DIAGNOSIS — Y939 Activity, unspecified: Secondary | ICD-10-CM | POA: Insufficient documentation

## 2013-01-10 DIAGNOSIS — S8390XA Sprain of unspecified site of unspecified knee, initial encounter: Secondary | ICD-10-CM

## 2013-01-10 DIAGNOSIS — W2209XA Striking against other stationary object, initial encounter: Secondary | ICD-10-CM | POA: Insufficient documentation

## 2013-01-10 DIAGNOSIS — IMO0002 Reserved for concepts with insufficient information to code with codable children: Secondary | ICD-10-CM | POA: Insufficient documentation

## 2013-01-10 MED ORDER — TRAMADOL HCL 50 MG PO TABS: 50.0000 mg | ORAL_TABLET | Freq: Four times a day (QID) | ORAL | Status: AC | PRN

## 2013-01-10 NOTE — ED Provider Notes (Signed)
History     CSN: [id number]  Arrival date & time 01/10/13  7829   First MD Initiated Contact with Patient 01/10/13 [phone]      Chief Complaint  Patient presents with  . Knee Injury  . Knee Pain    r/knee pain, "bumped knee" on corner of window frame 3 days ago    (Consider location/radiation/quality/duration/timing/severity/associated sxs/prior treatment) HPI  Tabrina Scotti-Rinaldi is a 32 y.o. female complaining of severe right knee pain worsening over the course of 3 days. Patient bumped knee on a windowsill several days ago it initially did not hurt that bad. She had used Advil and ice and was quite comfortable. The pain has been worsening over the course last several days. To the point where it is extremely painful to bend or bear weight on the affected knee. She denies any numbness paresthesia, history of prior trauma or surgeries to the affected knee.  Past Medical History  Diagnosis Date  . Placenta previa 2012    current pregnancy  . Breech presentation 2012    Past Surgical History  Procedure Laterality Date  . Wisdom tooth extraction    . Cesarean section  10/02/2011    Procedure: CESAREAN SECTION;  Surgeon: Turner Daniels, MD;  Location: [facility];  Service: Gynecology;  Laterality: N/A;  primary    No family history on file.  History  Substance Use Topics  . Smoking status: Current Every Day Smoker -- 3 years    Types: Cigarettes    Last Attempt to Quit: 11/29/2010  . Smokeless tobacco: Never Used  . Alcohol Use: No    OB History   Grav Para Term Preterm Abortions TAB SAB Ect Mult Living   2 2 2  0 0 0 0 0 0 2      Review of Systems  Constitutional: Negative for fever.  Respiratory: Negative for shortness of breath.   Cardiovascular: Negative for chest pain.  Gastrointestinal: Negative for nausea, vomiting, abdominal pain and diarrhea.  Musculoskeletal: Positive for arthralgias.  All other systems reviewed and are negative.    Allergies  Review of  patient's allergies indicates no known allergies.  Home Medications   Current Outpatient Rx  Name  Route  Sig  Dispense  Refill  . ibuprofen (ADVIL,MOTRIN) 200 MG tablet   Oral   Take 200 mg by mouth every 6 (six) hours as needed for pain.         . prenatal vitamin w/FE, FA (PRENATAL 1 + 1) 27-1 MG TABS   Oral   Take 1 tablet by mouth daily.           . Vitamin D, Ergocalciferol, (DRISDOL) 50000 UNITS CAPS   Oral   Take 50,000 Units by mouth.           BP 114/70  Pulse 85  Temp(Src) 98.2 F (36.8 C) (Oral)  SpO2 98%  LMP 01/03/2013  Breastfeeding? No  Physical Exam  Nursing note and vitals reviewed. Constitutional: She is oriented to person, place, and time. She appears well-developed and well-nourished. No distress.  HENT:  Head: Normocephalic.  Eyes: Conjunctivae and EOM are normal.  Cardiovascular: Normal rate.   Pulmonary/Chest: Effort normal and breath sounds normal. No stridor.  Musculoskeletal: Normal range of motion.  Right Knee: No deformity, erythema or abrasions. FROM. No effusion or crepitance. Anterior and posterior drawer show no abnormal laxity. Stable to valgus and varus stress. Joint lines are non-tender. Neurovascularly intact. Pt ambulates with antalgic gait.   Neurological:  She is alert and oriented to person, place, and time.  Psychiatric: She has a normal mood and affect.    ED Course  Procedures (including critical care time)  Labs Reviewed - No data to display Dg Knee Complete 4 Views Right  01/10/2013  *RADIOLOGY REPORT*  Clinical Data: Slipped and fell.  The anterior pain and swelling.  RIGHT KNEE - COMPLETE 4+ VIEW  Comparison: None.  Findings: No evidence of fracture, degenerative change, joint effusion or other focal finding.  IMPRESSION: Normal radiographs.   Original Report Authenticated By: Paulina Fusi, M.D.      1. Knee sprain      MDM  Knee [place]: Crutches and Ace wrap with RICE  Filed Vitals:   01/10/13 0948  BP:  114/70  Pulse: 85  Temp: 98.2 F (36.8 C)  TempSrc: Oral  SpO2: 98%     Pt verbalized understanding and agrees with care plan. Outpatient follow-up and return precautions given.    New Prescriptions   TRAMADOL (ULTRAM) 50 MG TABLET    Take 1 tablet (50 mg total) by mouth every 6 (six) hours as needed for pain.         Wynetta Emery, PA-C 01/10/13 1050

## 2013-01-10 NOTE — ED Notes (Signed)
Pt reports pain in r/knee x 3 days. Stated that she bumped it on the corner of her window sill. Advil and ice decreased pain

## 2013-01-12 NOTE — ED Provider Notes (Signed)
Medical screening examination/treatment/procedure(s) were performed by non-physician practitioner and as supervising physician I was immediately available for consultation/collaboration.   Suzi Roots, MD 01/12/13 [phone]

## 2014-09-25 ENCOUNTER — Encounter (HOSPITAL_COMMUNITY): Payer: Self-pay | Admitting: Emergency Medicine

## 2017-02-10 ENCOUNTER — Other Ambulatory Visit: Payer: Self-pay | Admitting: Internal Medicine

## 2017-02-10 DIAGNOSIS — E042 Nontoxic multinodular goiter: Secondary | ICD-10-CM

## 2017-02-10 DIAGNOSIS — E063 Autoimmune thyroiditis: Secondary | ICD-10-CM

## 2017-08-04 ENCOUNTER — Other Ambulatory Visit: Payer: Self-pay

## 2017-09-28 ENCOUNTER — Other Ambulatory Visit: Payer: Self-pay

## 2017-10-27 ENCOUNTER — Ambulatory Visit
Admission: RE | Admit: 2017-10-27 | Discharge: 2017-10-27 | Disposition: A | Discharge status: Home / Self Care | Payer: Self-pay | Source: Ambulatory Visit | Attending: Internal Medicine | Admitting: Internal Medicine

## 2017-10-27 DIAGNOSIS — E042 Nontoxic multinodular goiter: Secondary | ICD-10-CM

## 2017-10-27 DIAGNOSIS — E063 Autoimmune thyroiditis: Secondary | ICD-10-CM

## 2017-12-21 ENCOUNTER — Telehealth: Payer: Self-pay | Admitting: Pediatrics

## 2019-01-27 ENCOUNTER — Other Ambulatory Visit: Payer: Self-pay | Admitting: Obstetrics and Gynecology

## 2019-01-27 DIAGNOSIS — R928 Other abnormal and inconclusive findings on diagnostic imaging of breast: Secondary | ICD-10-CM

## 2019-02-01 ENCOUNTER — Other Ambulatory Visit: Payer: Self-pay | Admitting: Obstetrics and Gynecology

## 2019-02-01 ENCOUNTER — Ambulatory Visit
Admission: RE | Admit: 2019-02-01 | Discharge: 2019-02-01 | Disposition: A | Discharge status: Home / Self Care | Payer: BLUE CROSS/BLUE SHIELD | Source: Ambulatory Visit | Attending: Obstetrics and Gynecology | Admitting: Obstetrics and Gynecology

## 2019-02-01 DIAGNOSIS — R928 Other abnormal and inconclusive findings on diagnostic imaging of breast: Secondary | ICD-10-CM

## 2019-02-01 DIAGNOSIS — D242 Benign neoplasm of left breast: Secondary | ICD-10-CM

## 2019-08-09 ENCOUNTER — Other Ambulatory Visit: Payer: Self-pay | Admitting: Obstetrics and Gynecology

## 2019-08-09 ENCOUNTER — Other Ambulatory Visit: Payer: Self-pay

## 2019-08-09 ENCOUNTER — Ambulatory Visit
Admission: RE | Admit: 2019-08-09 | Discharge: 2019-08-09 | Disposition: A | Discharge status: Home / Self Care | Payer: 59 | Source: Ambulatory Visit | Attending: Obstetrics and Gynecology | Admitting: Obstetrics and Gynecology

## 2019-08-09 DIAGNOSIS — D242 Benign neoplasm of left breast: Secondary | ICD-10-CM

## 2019-08-09 DIAGNOSIS — N6320 Unspecified lump in the left breast, unspecified quadrant: Secondary | ICD-10-CM

## 2020-02-06 ENCOUNTER — Other Ambulatory Visit: Payer: No Typology Code available for payment source

## 2020-02-07 ENCOUNTER — Other Ambulatory Visit: Payer: No Typology Code available for payment source

## 2020-02-13 IMAGING — MG DIGITAL DIAGNOSTIC UNILATERAL LEFT MAMMOGRAM WITH TOMO AND CAD
6 series · 6 of 18 positions shown · non-contrast
Comparison: Baseline mammogram 01/25/2019.

CLINICAL DATA: Recall from baseline screening mammography with
tomosynthesis, possible mass involving the LOWER OUTER LEFT breast
at POSTERIOR depth.

EXAM:
DIGITAL DIAGNOSTIC LEFT MAMMOGRAM WITH TOMO
ULTRASOUND LEFT BREAST

[L CC synth-2D (1 of 2)]
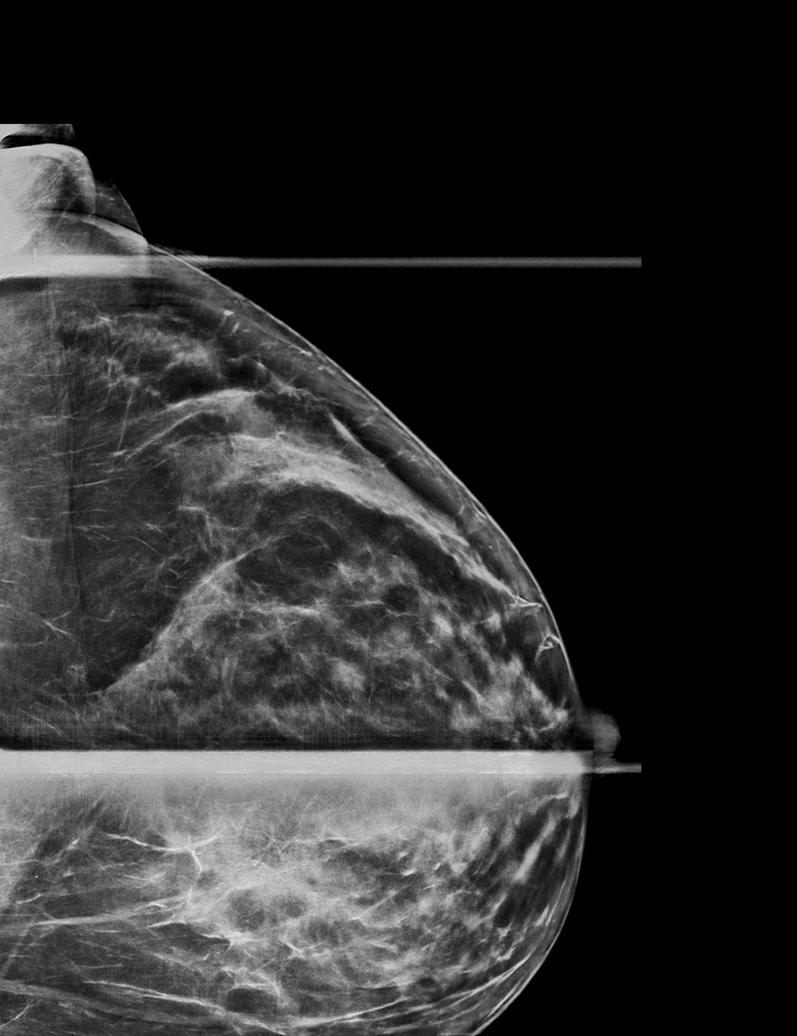

[L MLO synth-2D]
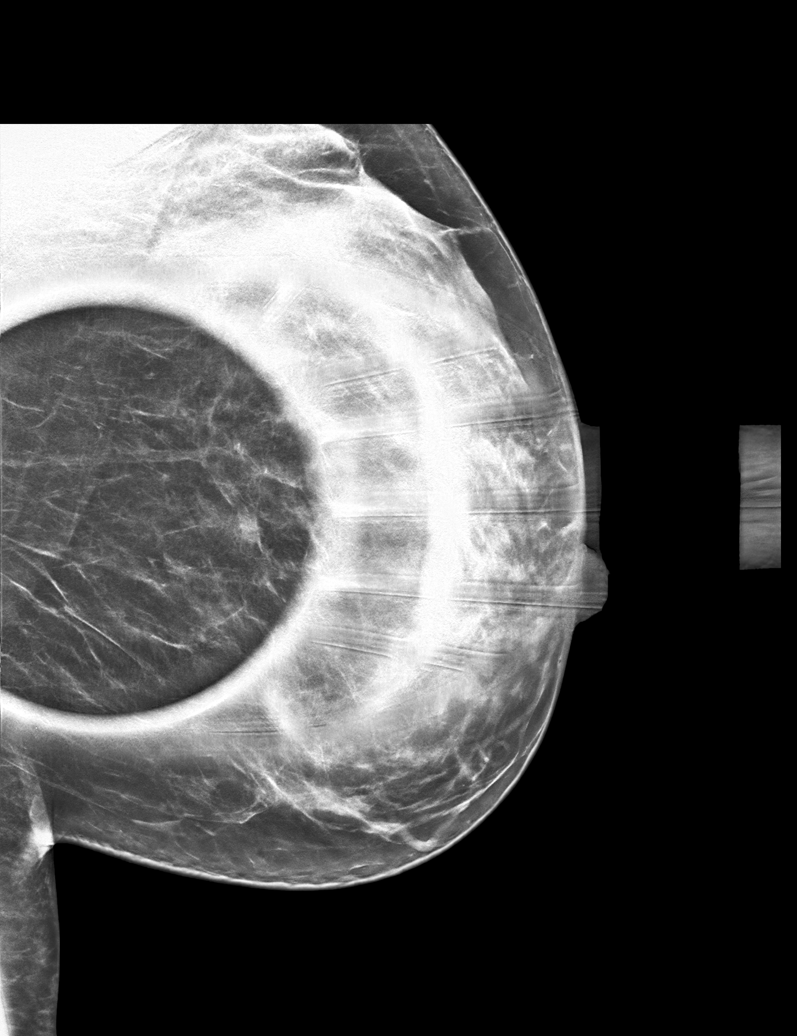

[L CC synth-2D (2 of 2)]
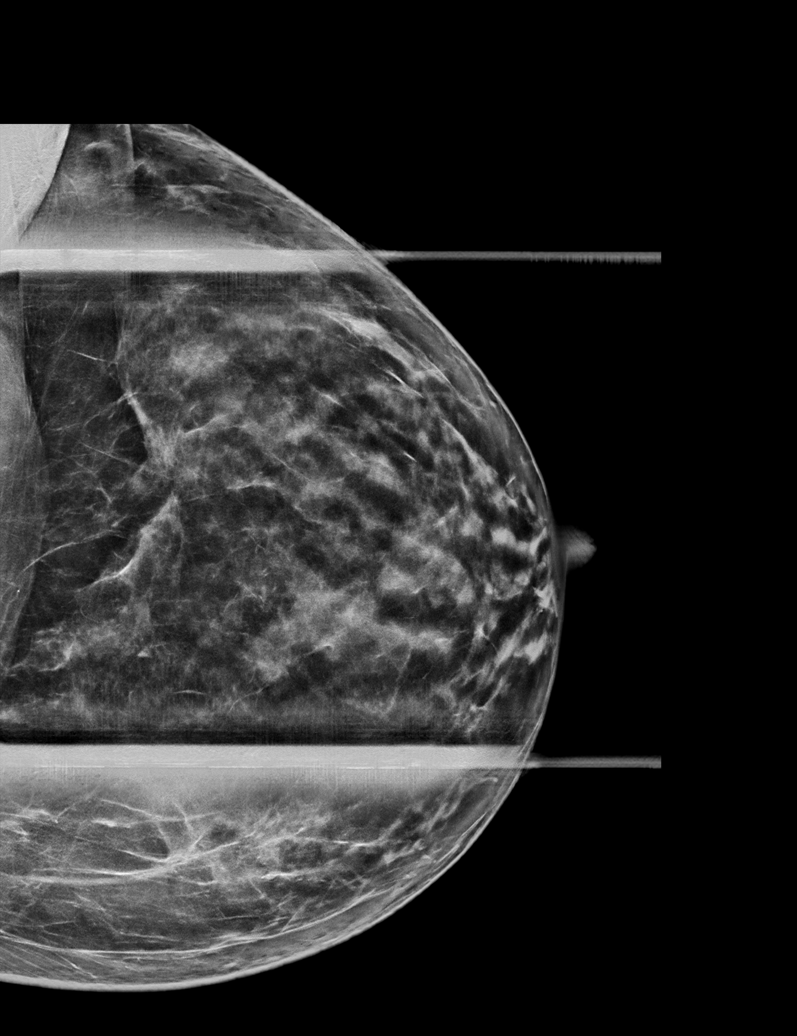

[L MLO tomo · tomo slice 32/63.0]
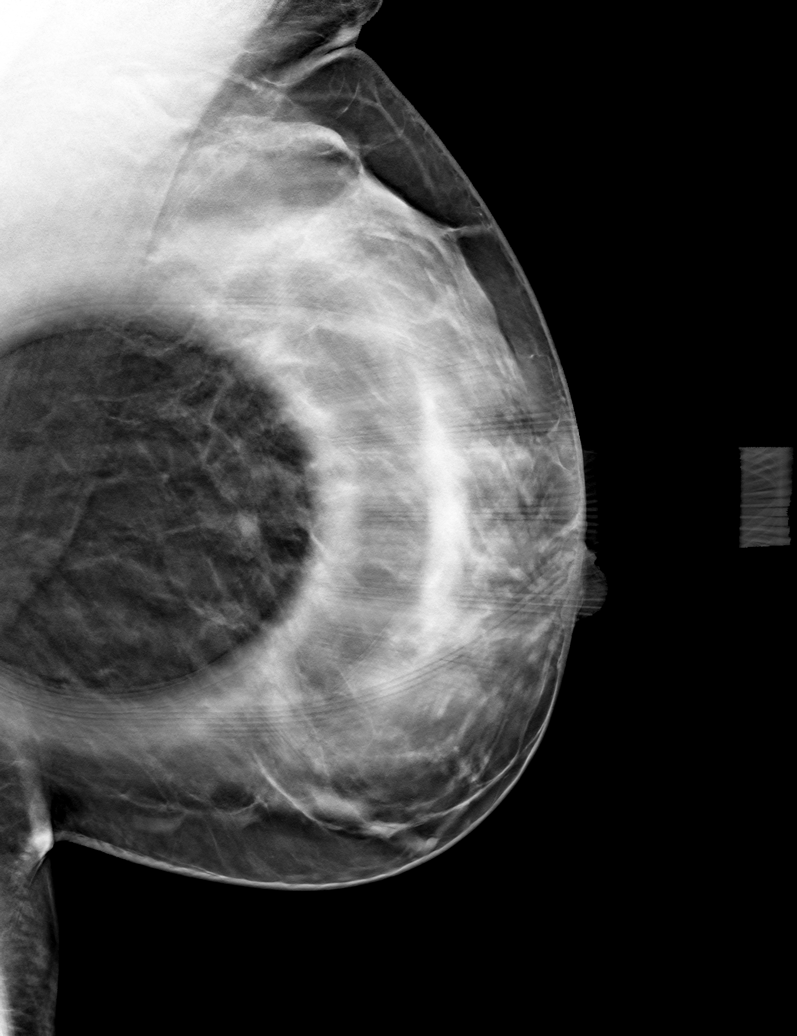

[L CC tomo (1 of 2) · tomo slice 37/73.0]
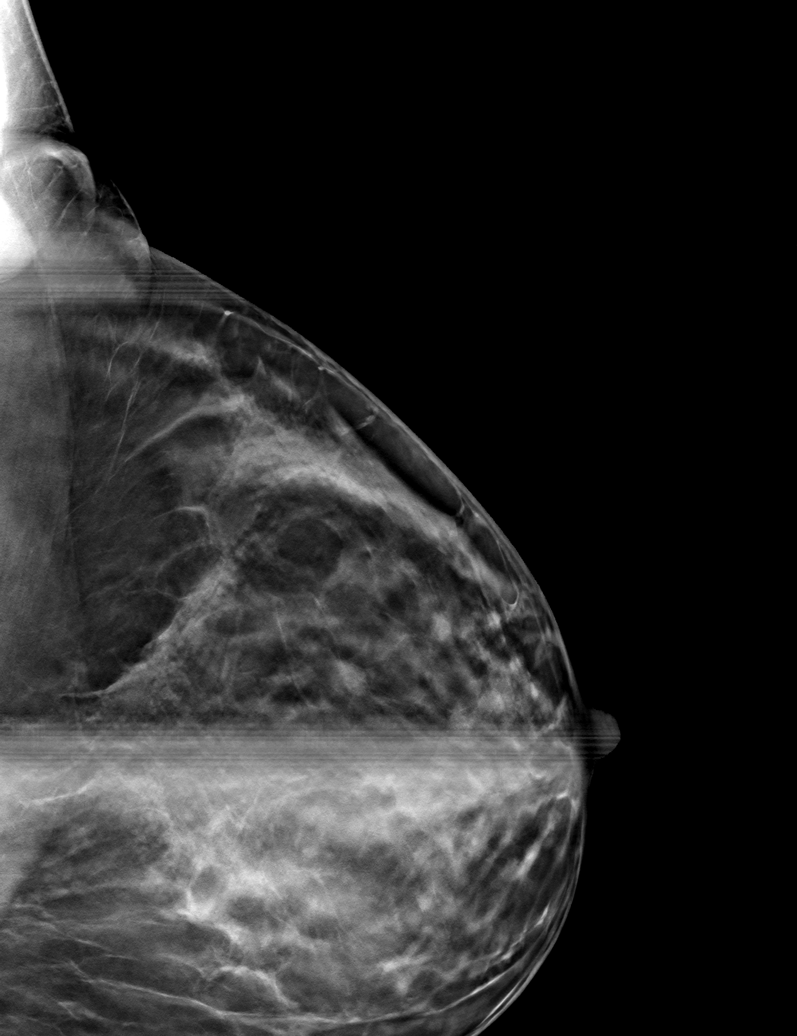

[L CC tomo (2 of 2) · tomo slice 37/73.0]
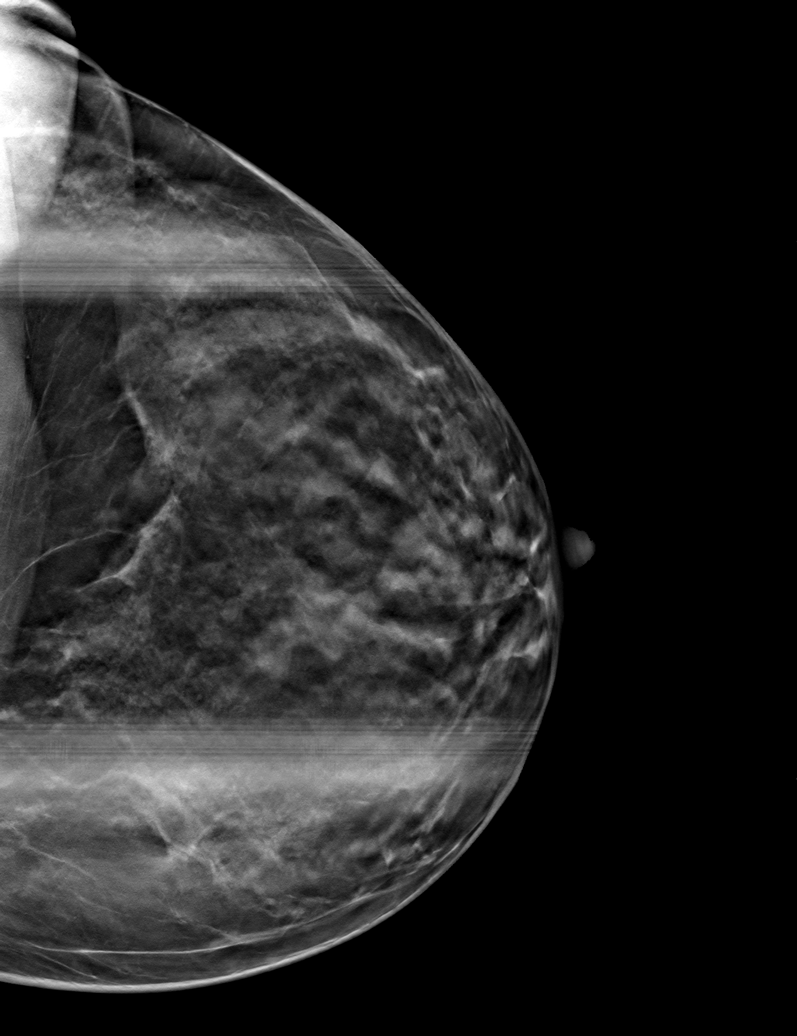

[6 of 18 positions shown; findings below may reference images not displayed]

ACR Breast Density Category c: The breast tissue is heterogeneously
dense, which may obscure small masses.
FINDINGS: Tomosynthesis and synthesized spot-compression CC and MLO views of
the area of concern in the LEFT breast were obtained.

The screening mammographic finding persists though partially effaces
on the spot compression images. There may be a circumscribed
low-density mass in the LOWER OUTER QUADRANT at POSTERIOR depth
which measures approximately 1 4 cm. There is no associated
architectural distortion or suspicious calcifications.

Targeted LEFT breast ultrasound is performed, showing an oval
circumscribed parallel hypoechoic mass at the 3:30 o'clock position
approximately 5 cm from the nipple measuring approximately 0.7 x
x 1.3 cm, demonstrating posterior acoustic enhancement and
demonstrating internal power Doppler flow, corresponding to the
screening mammographic finding.
IMPRESSION: Likely benign approximate 1.5 cm fibroadenoma involving the LOWER
OUTER QUADRANT of the LEFT breast at the 3:30 o'clock position
approximately 5 cm from the nipple, accounting for the baseline
screening mammographic finding.

RECOMMENDATION:
We discussed management options for the likely benign fibroadenoma
including excision, ultrasound-guided core biopsy, and short term
interval follow-up. Follow-up ultrasound is recommended at 6, 12 and
24 months to assess stability. The patient agrees with this plan.

I have discussed the findings and recommendations with the patient.
Results were also provided in writing at the conclusion of the
visit. If applicable, a reminder letter will be sent to the patient
regarding the next appointment.

BI-RADS CATEGORY  3: Probably benign.

## 2020-03-27 ENCOUNTER — Other Ambulatory Visit: Payer: Self-pay

## 2020-03-27 ENCOUNTER — Ambulatory Visit
Admission: RE | Admit: 2020-03-27 | Discharge: 2020-03-27 | Disposition: A | Discharge status: Home / Self Care | Payer: No Typology Code available for payment source | Source: Ambulatory Visit | Attending: Obstetrics and Gynecology | Admitting: Obstetrics and Gynecology

## 2020-03-27 DIAGNOSIS — N6320 Unspecified lump in the left breast, unspecified quadrant: Secondary | ICD-10-CM

## 2021-03-08 ENCOUNTER — Other Ambulatory Visit: Payer: Self-pay | Admitting: Physician Assistant

## 2021-03-08 DIAGNOSIS — M79604 Pain in right leg: Secondary | ICD-10-CM

## 2021-03-09 ENCOUNTER — Other Ambulatory Visit: Payer: Self-pay

## 2021-03-09 ENCOUNTER — Ambulatory Visit (HOSPITAL_COMMUNITY)
Admission: RE | Admit: 2021-03-09 | Discharge: 2021-03-09 | Disposition: A | Discharge status: Home / Self Care | Payer: No Typology Code available for payment source | Source: Ambulatory Visit | Attending: Physician Assistant | Admitting: Physician Assistant

## 2021-03-09 DIAGNOSIS — M79604 Pain in right leg: Secondary | ICD-10-CM | POA: Insufficient documentation

## 2021-03-09 NOTE — Progress Notes (Signed)
VASCULAR LAB    Right lower extremity venous duplex has been performed.  See CV proc for preliminary results.  Called report Matthew Saras, PA-C   Coltan Spinello, RVT 03/09/2021, 9:58 AM

## 2021-04-01 ENCOUNTER — Other Ambulatory Visit: Payer: Self-pay | Admitting: Obstetrics and Gynecology

## 2021-04-01 DIAGNOSIS — N6320 Unspecified lump in the left breast, unspecified quadrant: Secondary | ICD-10-CM

## 2021-05-06 ENCOUNTER — Other Ambulatory Visit: Payer: Self-pay | Admitting: Internal Medicine

## 2021-05-07 ENCOUNTER — Ambulatory Visit
Admission: RE | Admit: 2021-05-07 | Discharge: 2021-05-07 | Disposition: A | Discharge status: Home / Self Care | Payer: No Typology Code available for payment source | Source: Ambulatory Visit | Attending: Obstetrics and Gynecology | Admitting: Obstetrics and Gynecology

## 2021-05-07 ENCOUNTER — Other Ambulatory Visit: Payer: Self-pay

## 2021-05-07 DIAGNOSIS — N6320 Unspecified lump in the left breast, unspecified quadrant: Secondary | ICD-10-CM

## 2022-02-04 DIAGNOSIS — Z03818 Encounter for observation for suspected exposure to other biological agents ruled out: Secondary | ICD-10-CM | POA: Diagnosis not present

## 2022-02-04 DIAGNOSIS — J029 Acute pharyngitis, unspecified: Secondary | ICD-10-CM | POA: Diagnosis not present

## 2022-02-04 DIAGNOSIS — R059 Cough, unspecified: Secondary | ICD-10-CM | POA: Diagnosis not present

## 2022-02-04 DIAGNOSIS — H1031 Unspecified acute conjunctivitis, right eye: Secondary | ICD-10-CM | POA: Diagnosis not present

## 2022-02-04 DIAGNOSIS — R0981 Nasal congestion: Secondary | ICD-10-CM | POA: Diagnosis not present

## 2022-05-06 DIAGNOSIS — E039 Hypothyroidism, unspecified: Secondary | ICD-10-CM | POA: Diagnosis not present

## 2022-07-07 DIAGNOSIS — E039 Hypothyroidism, unspecified: Secondary | ICD-10-CM | POA: Diagnosis not present

## 2022-09-08 DIAGNOSIS — E039 Hypothyroidism, unspecified: Secondary | ICD-10-CM | POA: Diagnosis not present

## 2022-09-08 DIAGNOSIS — E063 Autoimmune thyroiditis: Secondary | ICD-10-CM | POA: Diagnosis not present

## 2022-09-08 DIAGNOSIS — E663 Overweight: Secondary | ICD-10-CM | POA: Diagnosis not present

## 2022-09-08 DIAGNOSIS — E042 Nontoxic multinodular goiter: Secondary | ICD-10-CM | POA: Diagnosis not present

## 2022-10-28 DIAGNOSIS — Z130 Encounter for screening for diseases of the blood and blood-forming organs and certain disorders involving the immune mechanism: Secondary | ICD-10-CM | POA: Diagnosis not present

## 2022-10-28 DIAGNOSIS — Z13220 Encounter for screening for lipoid disorders: Secondary | ICD-10-CM | POA: Diagnosis not present

## 2022-10-28 DIAGNOSIS — E559 Vitamin D deficiency, unspecified: Secondary | ICD-10-CM | POA: Diagnosis not present

## 2022-10-28 DIAGNOSIS — Z131 Encounter for screening for diabetes mellitus: Secondary | ICD-10-CM | POA: Diagnosis not present

## 2022-10-28 DIAGNOSIS — Z0000 Encounter for general adult medical examination without abnormal findings: Secondary | ICD-10-CM | POA: Diagnosis not present

## 2022-11-27 DIAGNOSIS — D72829 Elevated white blood cell count, unspecified: Secondary | ICD-10-CM | POA: Diagnosis not present

## 2023-03-16 DIAGNOSIS — E063 Autoimmune thyroiditis: Secondary | ICD-10-CM | POA: Diagnosis not present

## 2023-03-16 DIAGNOSIS — E042 Nontoxic multinodular goiter: Secondary | ICD-10-CM | POA: Diagnosis not present

## 2023-03-16 DIAGNOSIS — E039 Hypothyroidism, unspecified: Secondary | ICD-10-CM | POA: Diagnosis not present

## 2023-08-04 DIAGNOSIS — G5601 Carpal tunnel syndrome, right upper limb: Secondary | ICD-10-CM | POA: Diagnosis not present

## 2023-08-17 DIAGNOSIS — G5603 Carpal tunnel syndrome, bilateral upper limbs: Secondary | ICD-10-CM | POA: Diagnosis not present

## 2023-08-17 DIAGNOSIS — M79641 Pain in right hand: Secondary | ICD-10-CM | POA: Diagnosis not present

## 2023-08-17 DIAGNOSIS — M79642 Pain in left hand: Secondary | ICD-10-CM | POA: Diagnosis not present

## 2023-08-17 DIAGNOSIS — M65331 Trigger finger, right middle finger: Secondary | ICD-10-CM | POA: Diagnosis not present

## 2023-09-14 DIAGNOSIS — M79641 Pain in right hand: Secondary | ICD-10-CM | POA: Diagnosis not present

## 2023-09-14 DIAGNOSIS — M79642 Pain in left hand: Secondary | ICD-10-CM | POA: Diagnosis not present

## 2023-09-14 DIAGNOSIS — G5603 Carpal tunnel syndrome, bilateral upper limbs: Secondary | ICD-10-CM | POA: Diagnosis not present

## 2023-09-21 DIAGNOSIS — G5603 Carpal tunnel syndrome, bilateral upper limbs: Secondary | ICD-10-CM | POA: Diagnosis not present

## 2023-09-21 DIAGNOSIS — M65331 Trigger finger, right middle finger: Secondary | ICD-10-CM | POA: Diagnosis not present

## 2023-10-05 DIAGNOSIS — E042 Nontoxic multinodular goiter: Secondary | ICD-10-CM | POA: Diagnosis not present

## 2023-10-05 DIAGNOSIS — E039 Hypothyroidism, unspecified: Secondary | ICD-10-CM | POA: Diagnosis not present

## 2023-10-05 DIAGNOSIS — E063 Autoimmune thyroiditis: Secondary | ICD-10-CM | POA: Diagnosis not present

## 2023-10-16 DIAGNOSIS — G5601 Carpal tunnel syndrome, right upper limb: Secondary | ICD-10-CM | POA: Diagnosis not present

## 2023-10-16 DIAGNOSIS — M65331 Trigger finger, right middle finger: Secondary | ICD-10-CM | POA: Diagnosis not present

## 2023-10-26 DIAGNOSIS — G5603 Carpal tunnel syndrome, bilateral upper limbs: Secondary | ICD-10-CM | POA: Diagnosis not present

## 2023-11-09 DIAGNOSIS — Z131 Encounter for screening for diabetes mellitus: Secondary | ICD-10-CM | POA: Diagnosis not present

## 2023-11-09 DIAGNOSIS — E559 Vitamin D deficiency, unspecified: Secondary | ICD-10-CM | POA: Diagnosis not present

## 2023-11-09 DIAGNOSIS — Z130 Encounter for screening for diseases of the blood and blood-forming organs and certain disorders involving the immune mechanism: Secondary | ICD-10-CM | POA: Diagnosis not present

## 2023-11-09 DIAGNOSIS — Z13228 Encounter for screening for other metabolic disorders: Secondary | ICD-10-CM | POA: Diagnosis not present

## 2023-11-09 DIAGNOSIS — Z13220 Encounter for screening for lipoid disorders: Secondary | ICD-10-CM | POA: Diagnosis not present

## 2023-11-09 DIAGNOSIS — Z0000 Encounter for general adult medical examination without abnormal findings: Secondary | ICD-10-CM | POA: Diagnosis not present

## 2023-11-20 DIAGNOSIS — G5602 Carpal tunnel syndrome, left upper limb: Secondary | ICD-10-CM | POA: Diagnosis not present

## 2023-11-30 DIAGNOSIS — G5603 Carpal tunnel syndrome, bilateral upper limbs: Secondary | ICD-10-CM | POA: Diagnosis not present

## 2023-12-21 DIAGNOSIS — G5603 Carpal tunnel syndrome, bilateral upper limbs: Secondary | ICD-10-CM | POA: Diagnosis not present

## 2024-01-05 DIAGNOSIS — M65331 Trigger finger, right middle finger: Secondary | ICD-10-CM | POA: Diagnosis not present

## 2024-01-05 DIAGNOSIS — G5603 Carpal tunnel syndrome, bilateral upper limbs: Secondary | ICD-10-CM | POA: Diagnosis not present

## 2024-03-31 DIAGNOSIS — E039 Hypothyroidism, unspecified: Secondary | ICD-10-CM | POA: Diagnosis not present
